# Patient Record
Sex: Male | Born: 1990 | Race: Black or African American | Hispanic: No
Health system: Southern US, Community
[De-identification: ages and names within clinical notes are randomized; demographics above are authoritative.]

---
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
## 2016-06-20 DIAGNOSIS — M62830 Muscle spasm of back: Secondary | ICD-10-CM | POA: Diagnosis not present

## 2016-06-20 DIAGNOSIS — M9903 Segmental and somatic dysfunction of lumbar region: Secondary | ICD-10-CM | POA: Diagnosis not present

## 2016-06-20 DIAGNOSIS — M9904 Segmental and somatic dysfunction of sacral region: Secondary | ICD-10-CM | POA: Diagnosis not present

## 2016-06-20 DIAGNOSIS — M545 Low back pain: Secondary | ICD-10-CM | POA: Diagnosis not present

## 2016-07-12 DIAGNOSIS — M545 Low back pain: Secondary | ICD-10-CM | POA: Diagnosis not present

## 2016-07-12 DIAGNOSIS — M62830 Muscle spasm of back: Secondary | ICD-10-CM | POA: Diagnosis not present

## 2016-07-12 DIAGNOSIS — M9903 Segmental and somatic dysfunction of lumbar region: Secondary | ICD-10-CM | POA: Diagnosis not present

## 2016-07-12 DIAGNOSIS — M9904 Segmental and somatic dysfunction of sacral region: Secondary | ICD-10-CM | POA: Diagnosis not present

## 2016-07-23 DIAGNOSIS — Z1389 Encounter for screening for other disorder: Secondary | ICD-10-CM | POA: Diagnosis not present

## 2016-07-23 DIAGNOSIS — Z0000 Encounter for general adult medical examination without abnormal findings: Secondary | ICD-10-CM | POA: Diagnosis not present

## 2016-07-23 DIAGNOSIS — Z23 Encounter for immunization: Secondary | ICD-10-CM | POA: Diagnosis not present

## 2016-07-23 DIAGNOSIS — E782 Mixed hyperlipidemia: Secondary | ICD-10-CM | POA: Diagnosis not present

## 2016-08-02 DIAGNOSIS — M545 Low back pain: Secondary | ICD-10-CM | POA: Diagnosis not present

## 2016-08-02 DIAGNOSIS — M9903 Segmental and somatic dysfunction of lumbar region: Secondary | ICD-10-CM | POA: Diagnosis not present

## 2016-08-02 DIAGNOSIS — M62830 Muscle spasm of back: Secondary | ICD-10-CM | POA: Diagnosis not present

## 2016-08-02 DIAGNOSIS — M9904 Segmental and somatic dysfunction of sacral region: Secondary | ICD-10-CM | POA: Diagnosis not present

## 2016-08-03 DIAGNOSIS — L219 Seborrheic dermatitis, unspecified: Secondary | ICD-10-CM | POA: Diagnosis not present

## 2016-08-03 DIAGNOSIS — L700 Acne vulgaris: Secondary | ICD-10-CM | POA: Diagnosis not present

## 2016-08-29 DIAGNOSIS — M9903 Segmental and somatic dysfunction of lumbar region: Secondary | ICD-10-CM | POA: Diagnosis not present

## 2016-08-29 DIAGNOSIS — M62830 Muscle spasm of back: Secondary | ICD-10-CM | POA: Diagnosis not present

## 2016-08-29 DIAGNOSIS — M9904 Segmental and somatic dysfunction of sacral region: Secondary | ICD-10-CM | POA: Diagnosis not present

## 2016-08-29 DIAGNOSIS — M545 Low back pain: Secondary | ICD-10-CM | POA: Diagnosis not present

## 2016-09-27 DIAGNOSIS — M9903 Segmental and somatic dysfunction of lumbar region: Secondary | ICD-10-CM | POA: Diagnosis not present

## 2016-09-27 DIAGNOSIS — M9904 Segmental and somatic dysfunction of sacral region: Secondary | ICD-10-CM | POA: Diagnosis not present

## 2016-09-27 DIAGNOSIS — M545 Low back pain: Secondary | ICD-10-CM | POA: Diagnosis not present

## 2016-09-27 DIAGNOSIS — M62830 Muscle spasm of back: Secondary | ICD-10-CM | POA: Diagnosis not present

## 2016-10-29 DIAGNOSIS — M62830 Muscle spasm of back: Secondary | ICD-10-CM | POA: Diagnosis not present

## 2016-10-29 DIAGNOSIS — M9903 Segmental and somatic dysfunction of lumbar region: Secondary | ICD-10-CM | POA: Diagnosis not present

## 2016-10-29 DIAGNOSIS — M9904 Segmental and somatic dysfunction of sacral region: Secondary | ICD-10-CM | POA: Diagnosis not present

## 2016-10-29 DIAGNOSIS — M545 Low back pain: Secondary | ICD-10-CM | POA: Diagnosis not present

## 2016-11-06 DIAGNOSIS — L700 Acne vulgaris: Secondary | ICD-10-CM | POA: Diagnosis not present

## 2016-11-12 DIAGNOSIS — M9904 Segmental and somatic dysfunction of sacral region: Secondary | ICD-10-CM | POA: Diagnosis not present

## 2016-11-12 DIAGNOSIS — M62830 Muscle spasm of back: Secondary | ICD-10-CM | POA: Diagnosis not present

## 2016-11-12 DIAGNOSIS — M545 Low back pain: Secondary | ICD-10-CM | POA: Diagnosis not present

## 2016-11-12 DIAGNOSIS — M9903 Segmental and somatic dysfunction of lumbar region: Secondary | ICD-10-CM | POA: Diagnosis not present

## 2016-11-27 DIAGNOSIS — M545 Low back pain: Secondary | ICD-10-CM | POA: Diagnosis not present

## 2016-11-27 DIAGNOSIS — M62830 Muscle spasm of back: Secondary | ICD-10-CM | POA: Diagnosis not present

## 2016-11-27 DIAGNOSIS — M9903 Segmental and somatic dysfunction of lumbar region: Secondary | ICD-10-CM | POA: Diagnosis not present

## 2016-11-27 DIAGNOSIS — M9904 Segmental and somatic dysfunction of sacral region: Secondary | ICD-10-CM | POA: Diagnosis not present

## 2016-12-18 DIAGNOSIS — M9904 Segmental and somatic dysfunction of sacral region: Secondary | ICD-10-CM | POA: Diagnosis not present

## 2016-12-18 DIAGNOSIS — M545 Low back pain: Secondary | ICD-10-CM | POA: Diagnosis not present

## 2016-12-18 DIAGNOSIS — M9903 Segmental and somatic dysfunction of lumbar region: Secondary | ICD-10-CM | POA: Diagnosis not present

## 2016-12-18 DIAGNOSIS — M62830 Muscle spasm of back: Secondary | ICD-10-CM | POA: Diagnosis not present

## 2017-01-01 DIAGNOSIS — M9904 Segmental and somatic dysfunction of sacral region: Secondary | ICD-10-CM | POA: Diagnosis not present

## 2017-01-01 DIAGNOSIS — M62830 Muscle spasm of back: Secondary | ICD-10-CM | POA: Diagnosis not present

## 2017-01-01 DIAGNOSIS — M9903 Segmental and somatic dysfunction of lumbar region: Secondary | ICD-10-CM | POA: Diagnosis not present

## 2017-01-01 DIAGNOSIS — M545 Low back pain: Secondary | ICD-10-CM | POA: Diagnosis not present

## 2017-01-23 DIAGNOSIS — M9903 Segmental and somatic dysfunction of lumbar region: Secondary | ICD-10-CM | POA: Diagnosis not present

## 2017-01-23 DIAGNOSIS — M9904 Segmental and somatic dysfunction of sacral region: Secondary | ICD-10-CM | POA: Diagnosis not present

## 2017-01-23 DIAGNOSIS — M62830 Muscle spasm of back: Secondary | ICD-10-CM | POA: Diagnosis not present

## 2017-01-23 DIAGNOSIS — M545 Low back pain: Secondary | ICD-10-CM | POA: Diagnosis not present

## 2017-01-29 DIAGNOSIS — B351 Tinea unguium: Secondary | ICD-10-CM | POA: Diagnosis not present

## 2017-01-29 DIAGNOSIS — R0681 Apnea, not elsewhere classified: Secondary | ICD-10-CM | POA: Diagnosis not present

## 2017-01-29 DIAGNOSIS — R0683 Snoring: Secondary | ICD-10-CM | POA: Diagnosis not present

## 2017-01-29 DIAGNOSIS — R5383 Other fatigue: Secondary | ICD-10-CM | POA: Diagnosis not present

## 2017-02-08 DIAGNOSIS — L700 Acne vulgaris: Secondary | ICD-10-CM | POA: Diagnosis not present

## 2017-02-25 DIAGNOSIS — M9903 Segmental and somatic dysfunction of lumbar region: Secondary | ICD-10-CM | POA: Diagnosis not present

## 2017-02-25 DIAGNOSIS — M62830 Muscle spasm of back: Secondary | ICD-10-CM | POA: Diagnosis not present

## 2017-02-25 DIAGNOSIS — M9904 Segmental and somatic dysfunction of sacral region: Secondary | ICD-10-CM | POA: Diagnosis not present

## 2017-02-25 DIAGNOSIS — M545 Low back pain: Secondary | ICD-10-CM | POA: Diagnosis not present

## 2017-03-13 DIAGNOSIS — R197 Diarrhea, unspecified: Secondary | ICD-10-CM | POA: Diagnosis not present

## 2017-03-29 DIAGNOSIS — R197 Diarrhea, unspecified: Secondary | ICD-10-CM | POA: Diagnosis not present

## 2017-03-29 DIAGNOSIS — K529 Noninfective gastroenteritis and colitis, unspecified: Secondary | ICD-10-CM | POA: Diagnosis not present

## 2017-06-05 DIAGNOSIS — L700 Acne vulgaris: Secondary | ICD-10-CM | POA: Diagnosis not present

## 2017-06-11 DIAGNOSIS — M9904 Segmental and somatic dysfunction of sacral region: Secondary | ICD-10-CM | POA: Diagnosis not present

## 2017-06-11 DIAGNOSIS — M62830 Muscle spasm of back: Secondary | ICD-10-CM | POA: Diagnosis not present

## 2017-06-11 DIAGNOSIS — M545 Low back pain: Secondary | ICD-10-CM | POA: Diagnosis not present

## 2017-06-11 DIAGNOSIS — M9903 Segmental and somatic dysfunction of lumbar region: Secondary | ICD-10-CM | POA: Diagnosis not present

## 2017-07-23 DIAGNOSIS — Z1389 Encounter for screening for other disorder: Secondary | ICD-10-CM | POA: Diagnosis not present

## 2017-07-23 DIAGNOSIS — Z0000 Encounter for general adult medical examination without abnormal findings: Secondary | ICD-10-CM | POA: Diagnosis not present

## 2017-07-23 DIAGNOSIS — Z23 Encounter for immunization: Secondary | ICD-10-CM | POA: Diagnosis not present

## 2017-08-01 DIAGNOSIS — M9903 Segmental and somatic dysfunction of lumbar region: Secondary | ICD-10-CM | POA: Diagnosis not present

## 2017-08-01 DIAGNOSIS — M545 Low back pain: Secondary | ICD-10-CM | POA: Diagnosis not present

## 2017-08-01 DIAGNOSIS — M62830 Muscle spasm of back: Secondary | ICD-10-CM | POA: Diagnosis not present

## 2017-08-01 DIAGNOSIS — M9904 Segmental and somatic dysfunction of sacral region: Secondary | ICD-10-CM | POA: Diagnosis not present

## 2017-08-03 DIAGNOSIS — J029 Acute pharyngitis, unspecified: Secondary | ICD-10-CM | POA: Diagnosis not present

## 2017-08-03 DIAGNOSIS — J019 Acute sinusitis, unspecified: Secondary | ICD-10-CM | POA: Diagnosis not present

## 2017-08-07 DIAGNOSIS — H33302 Unspecified retinal break, left eye: Secondary | ICD-10-CM | POA: Diagnosis not present

## 2017-08-07 DIAGNOSIS — H5213 Myopia, bilateral: Secondary | ICD-10-CM | POA: Diagnosis not present

## 2017-08-08 DIAGNOSIS — M545 Low back pain: Secondary | ICD-10-CM | POA: Diagnosis not present

## 2017-08-08 DIAGNOSIS — M9903 Segmental and somatic dysfunction of lumbar region: Secondary | ICD-10-CM | POA: Diagnosis not present

## 2017-08-08 DIAGNOSIS — M9904 Segmental and somatic dysfunction of sacral region: Secondary | ICD-10-CM | POA: Diagnosis not present

## 2017-08-08 DIAGNOSIS — Z0000 Encounter for general adult medical examination without abnormal findings: Secondary | ICD-10-CM | POA: Diagnosis not present

## 2017-08-08 DIAGNOSIS — M62830 Muscle spasm of back: Secondary | ICD-10-CM | POA: Diagnosis not present

## 2017-08-09 DIAGNOSIS — B029 Zoster without complications: Secondary | ICD-10-CM | POA: Diagnosis not present

## 2017-08-09 DIAGNOSIS — J069 Acute upper respiratory infection, unspecified: Secondary | ICD-10-CM | POA: Diagnosis not present

## 2017-08-29 DIAGNOSIS — M9904 Segmental and somatic dysfunction of sacral region: Secondary | ICD-10-CM | POA: Diagnosis not present

## 2017-08-29 DIAGNOSIS — M62830 Muscle spasm of back: Secondary | ICD-10-CM | POA: Diagnosis not present

## 2017-08-29 DIAGNOSIS — M9903 Segmental and somatic dysfunction of lumbar region: Secondary | ICD-10-CM | POA: Diagnosis not present

## 2017-08-29 DIAGNOSIS — M545 Low back pain: Secondary | ICD-10-CM | POA: Diagnosis not present

## 2017-09-10 DIAGNOSIS — G4733 Obstructive sleep apnea (adult) (pediatric): Secondary | ICD-10-CM | POA: Diagnosis not present

## 2018-02-26 DIAGNOSIS — M62830 Muscle spasm of back: Secondary | ICD-10-CM | POA: Diagnosis not present

## 2018-02-26 DIAGNOSIS — M545 Low back pain: Secondary | ICD-10-CM | POA: Diagnosis not present

## 2018-02-26 DIAGNOSIS — M9904 Segmental and somatic dysfunction of sacral region: Secondary | ICD-10-CM | POA: Diagnosis not present

## 2018-02-26 DIAGNOSIS — M9903 Segmental and somatic dysfunction of lumbar region: Secondary | ICD-10-CM | POA: Diagnosis not present

## 2018-07-29 DIAGNOSIS — Z23 Encounter for immunization: Secondary | ICD-10-CM | POA: Diagnosis not present

## 2018-07-29 DIAGNOSIS — Z1389 Encounter for screening for other disorder: Secondary | ICD-10-CM | POA: Diagnosis not present

## 2018-07-29 DIAGNOSIS — Z0000 Encounter for general adult medical examination without abnormal findings: Secondary | ICD-10-CM | POA: Diagnosis not present

## 2018-07-29 DIAGNOSIS — E782 Mixed hyperlipidemia: Secondary | ICD-10-CM | POA: Diagnosis not present

## 2018-09-30 DIAGNOSIS — H35412 Lattice degeneration of retina, left eye: Secondary | ICD-10-CM | POA: Diagnosis not present

## 2018-09-30 DIAGNOSIS — H33302 Unspecified retinal break, left eye: Secondary | ICD-10-CM | POA: Diagnosis not present

## 2018-09-30 DIAGNOSIS — H5213 Myopia, bilateral: Secondary | ICD-10-CM | POA: Diagnosis not present

## 2018-10-06 DIAGNOSIS — M9904 Segmental and somatic dysfunction of sacral region: Secondary | ICD-10-CM | POA: Diagnosis not present

## 2018-10-06 DIAGNOSIS — M545 Low back pain: Secondary | ICD-10-CM | POA: Diagnosis not present

## 2018-10-06 DIAGNOSIS — M9903 Segmental and somatic dysfunction of lumbar region: Secondary | ICD-10-CM | POA: Diagnosis not present

## 2018-10-06 DIAGNOSIS — M62830 Muscle spasm of back: Secondary | ICD-10-CM | POA: Diagnosis not present

## 2018-10-13 DIAGNOSIS — M62830 Muscle spasm of back: Secondary | ICD-10-CM | POA: Diagnosis not present

## 2018-10-13 DIAGNOSIS — M9904 Segmental and somatic dysfunction of sacral region: Secondary | ICD-10-CM | POA: Diagnosis not present

## 2018-10-13 DIAGNOSIS — M545 Low back pain: Secondary | ICD-10-CM | POA: Diagnosis not present

## 2018-10-13 DIAGNOSIS — M9903 Segmental and somatic dysfunction of lumbar region: Secondary | ICD-10-CM | POA: Diagnosis not present

## 2018-10-27 DIAGNOSIS — M9904 Segmental and somatic dysfunction of sacral region: Secondary | ICD-10-CM | POA: Diagnosis not present

## 2018-10-27 DIAGNOSIS — M62830 Muscle spasm of back: Secondary | ICD-10-CM | POA: Diagnosis not present

## 2018-10-27 DIAGNOSIS — M9903 Segmental and somatic dysfunction of lumbar region: Secondary | ICD-10-CM | POA: Diagnosis not present

## 2018-10-27 DIAGNOSIS — M545 Low back pain: Secondary | ICD-10-CM | POA: Diagnosis not present

## 2018-11-05 DIAGNOSIS — M545 Low back pain: Secondary | ICD-10-CM | POA: Diagnosis not present

## 2018-11-05 DIAGNOSIS — M9904 Segmental and somatic dysfunction of sacral region: Secondary | ICD-10-CM | POA: Diagnosis not present

## 2018-11-05 DIAGNOSIS — M62830 Muscle spasm of back: Secondary | ICD-10-CM | POA: Diagnosis not present

## 2018-11-05 DIAGNOSIS — M9903 Segmental and somatic dysfunction of lumbar region: Secondary | ICD-10-CM | POA: Diagnosis not present

## 2018-12-31 DIAGNOSIS — M62830 Muscle spasm of back: Secondary | ICD-10-CM | POA: Diagnosis not present

## 2018-12-31 DIAGNOSIS — M545 Low back pain: Secondary | ICD-10-CM | POA: Diagnosis not present

## 2018-12-31 DIAGNOSIS — M9904 Segmental and somatic dysfunction of sacral region: Secondary | ICD-10-CM | POA: Diagnosis not present

## 2018-12-31 DIAGNOSIS — M9903 Segmental and somatic dysfunction of lumbar region: Secondary | ICD-10-CM | POA: Diagnosis not present

## 2019-01-14 DIAGNOSIS — M62830 Muscle spasm of back: Secondary | ICD-10-CM | POA: Diagnosis not present

## 2019-01-14 DIAGNOSIS — M545 Low back pain: Secondary | ICD-10-CM | POA: Diagnosis not present

## 2019-01-14 DIAGNOSIS — M9904 Segmental and somatic dysfunction of sacral region: Secondary | ICD-10-CM | POA: Diagnosis not present

## 2019-01-14 DIAGNOSIS — M9903 Segmental and somatic dysfunction of lumbar region: Secondary | ICD-10-CM | POA: Diagnosis not present

## 2019-01-28 DIAGNOSIS — M9904 Segmental and somatic dysfunction of sacral region: Secondary | ICD-10-CM | POA: Diagnosis not present

## 2019-01-28 DIAGNOSIS — M9903 Segmental and somatic dysfunction of lumbar region: Secondary | ICD-10-CM | POA: Diagnosis not present

## 2019-01-28 DIAGNOSIS — M62830 Muscle spasm of back: Secondary | ICD-10-CM | POA: Diagnosis not present

## 2019-01-28 DIAGNOSIS — M545 Low back pain: Secondary | ICD-10-CM | POA: Diagnosis not present

## 2019-02-12 DIAGNOSIS — M62830 Muscle spasm of back: Secondary | ICD-10-CM | POA: Diagnosis not present

## 2019-02-12 DIAGNOSIS — M545 Low back pain: Secondary | ICD-10-CM | POA: Diagnosis not present

## 2019-02-12 DIAGNOSIS — M9904 Segmental and somatic dysfunction of sacral region: Secondary | ICD-10-CM | POA: Diagnosis not present

## 2019-02-12 DIAGNOSIS — M9903 Segmental and somatic dysfunction of lumbar region: Secondary | ICD-10-CM | POA: Diagnosis not present

## 2019-02-26 DIAGNOSIS — M545 Low back pain: Secondary | ICD-10-CM | POA: Diagnosis not present

## 2019-02-26 DIAGNOSIS — M62830 Muscle spasm of back: Secondary | ICD-10-CM | POA: Diagnosis not present

## 2019-02-26 DIAGNOSIS — M9903 Segmental and somatic dysfunction of lumbar region: Secondary | ICD-10-CM | POA: Diagnosis not present

## 2019-02-26 DIAGNOSIS — M9904 Segmental and somatic dysfunction of sacral region: Secondary | ICD-10-CM | POA: Diagnosis not present

## 2019-03-11 DIAGNOSIS — M542 Cervicalgia: Secondary | ICD-10-CM | POA: Diagnosis not present

## 2019-03-11 DIAGNOSIS — M9902 Segmental and somatic dysfunction of thoracic region: Secondary | ICD-10-CM | POA: Diagnosis not present

## 2019-03-11 DIAGNOSIS — M9901 Segmental and somatic dysfunction of cervical region: Secondary | ICD-10-CM | POA: Diagnosis not present

## 2019-03-11 DIAGNOSIS — M9907 Segmental and somatic dysfunction of upper extremity: Secondary | ICD-10-CM | POA: Diagnosis not present

## 2019-03-25 DIAGNOSIS — M542 Cervicalgia: Secondary | ICD-10-CM | POA: Diagnosis not present

## 2019-03-25 DIAGNOSIS — M9901 Segmental and somatic dysfunction of cervical region: Secondary | ICD-10-CM | POA: Diagnosis not present

## 2019-03-25 DIAGNOSIS — M9902 Segmental and somatic dysfunction of thoracic region: Secondary | ICD-10-CM | POA: Diagnosis not present

## 2019-03-25 DIAGNOSIS — M9907 Segmental and somatic dysfunction of upper extremity: Secondary | ICD-10-CM | POA: Diagnosis not present

## 2019-04-15 DIAGNOSIS — M9907 Segmental and somatic dysfunction of upper extremity: Secondary | ICD-10-CM | POA: Diagnosis not present

## 2019-04-15 DIAGNOSIS — M9901 Segmental and somatic dysfunction of cervical region: Secondary | ICD-10-CM | POA: Diagnosis not present

## 2019-04-15 DIAGNOSIS — M9902 Segmental and somatic dysfunction of thoracic region: Secondary | ICD-10-CM | POA: Diagnosis not present

## 2019-04-15 DIAGNOSIS — M542 Cervicalgia: Secondary | ICD-10-CM | POA: Diagnosis not present

## 2019-05-13 DIAGNOSIS — M9901 Segmental and somatic dysfunction of cervical region: Secondary | ICD-10-CM | POA: Diagnosis not present

## 2019-05-13 DIAGNOSIS — M9902 Segmental and somatic dysfunction of thoracic region: Secondary | ICD-10-CM | POA: Diagnosis not present

## 2019-05-13 DIAGNOSIS — M9907 Segmental and somatic dysfunction of upper extremity: Secondary | ICD-10-CM | POA: Diagnosis not present

## 2019-05-13 DIAGNOSIS — M542 Cervicalgia: Secondary | ICD-10-CM | POA: Diagnosis not present

## 2019-05-27 DIAGNOSIS — M542 Cervicalgia: Secondary | ICD-10-CM | POA: Diagnosis not present

## 2019-05-27 DIAGNOSIS — M9907 Segmental and somatic dysfunction of upper extremity: Secondary | ICD-10-CM | POA: Diagnosis not present

## 2019-05-27 DIAGNOSIS — M9902 Segmental and somatic dysfunction of thoracic region: Secondary | ICD-10-CM | POA: Diagnosis not present

## 2019-05-27 DIAGNOSIS — M9901 Segmental and somatic dysfunction of cervical region: Secondary | ICD-10-CM | POA: Diagnosis not present

## 2019-06-04 DIAGNOSIS — U071 COVID-19: Secondary | ICD-10-CM | POA: Diagnosis not present

## 2019-06-04 DIAGNOSIS — Z1159 Encounter for screening for other viral diseases: Secondary | ICD-10-CM | POA: Diagnosis not present

## 2019-06-10 DIAGNOSIS — M9902 Segmental and somatic dysfunction of thoracic region: Secondary | ICD-10-CM | POA: Diagnosis not present

## 2019-06-10 DIAGNOSIS — M542 Cervicalgia: Secondary | ICD-10-CM | POA: Diagnosis not present

## 2019-06-10 DIAGNOSIS — M9901 Segmental and somatic dysfunction of cervical region: Secondary | ICD-10-CM | POA: Diagnosis not present

## 2019-06-10 DIAGNOSIS — M9907 Segmental and somatic dysfunction of upper extremity: Secondary | ICD-10-CM | POA: Diagnosis not present

## 2019-06-12 DIAGNOSIS — Z20828 Contact with and (suspected) exposure to other viral communicable diseases: Secondary | ICD-10-CM | POA: Diagnosis not present

## 2019-06-12 DIAGNOSIS — Z1159 Encounter for screening for other viral diseases: Secondary | ICD-10-CM | POA: Diagnosis not present

## 2019-06-12 DIAGNOSIS — U071 COVID-19: Secondary | ICD-10-CM | POA: Diagnosis not present

## 2019-07-01 DIAGNOSIS — M9901 Segmental and somatic dysfunction of cervical region: Secondary | ICD-10-CM | POA: Diagnosis not present

## 2019-07-01 DIAGNOSIS — M9902 Segmental and somatic dysfunction of thoracic region: Secondary | ICD-10-CM | POA: Diagnosis not present

## 2019-07-01 DIAGNOSIS — M9907 Segmental and somatic dysfunction of upper extremity: Secondary | ICD-10-CM | POA: Diagnosis not present

## 2019-07-01 DIAGNOSIS — M542 Cervicalgia: Secondary | ICD-10-CM | POA: Diagnosis not present

## 2019-07-29 DIAGNOSIS — M9907 Segmental and somatic dysfunction of upper extremity: Secondary | ICD-10-CM | POA: Diagnosis not present

## 2019-07-29 DIAGNOSIS — M9901 Segmental and somatic dysfunction of cervical region: Secondary | ICD-10-CM | POA: Diagnosis not present

## 2019-07-29 DIAGNOSIS — M542 Cervicalgia: Secondary | ICD-10-CM | POA: Diagnosis not present

## 2019-07-29 DIAGNOSIS — M9902 Segmental and somatic dysfunction of thoracic region: Secondary | ICD-10-CM | POA: Diagnosis not present

## 2019-07-31 DIAGNOSIS — E782 Mixed hyperlipidemia: Secondary | ICD-10-CM | POA: Diagnosis not present

## 2019-07-31 DIAGNOSIS — Z0000 Encounter for general adult medical examination without abnormal findings: Secondary | ICD-10-CM | POA: Diagnosis not present

## 2019-07-31 DIAGNOSIS — Z1389 Encounter for screening for other disorder: Secondary | ICD-10-CM | POA: Diagnosis not present

## 2019-08-18 DIAGNOSIS — M542 Cervicalgia: Secondary | ICD-10-CM | POA: Diagnosis not present

## 2019-08-18 DIAGNOSIS — M9902 Segmental and somatic dysfunction of thoracic region: Secondary | ICD-10-CM | POA: Diagnosis not present

## 2019-08-18 DIAGNOSIS — M9907 Segmental and somatic dysfunction of upper extremity: Secondary | ICD-10-CM | POA: Diagnosis not present

## 2019-08-18 DIAGNOSIS — M9901 Segmental and somatic dysfunction of cervical region: Secondary | ICD-10-CM | POA: Diagnosis not present

## 2019-08-31 DIAGNOSIS — U071 COVID-19: Secondary | ICD-10-CM | POA: Diagnosis not present

## 2019-08-31 DIAGNOSIS — Z1159 Encounter for screening for other viral diseases: Secondary | ICD-10-CM | POA: Diagnosis not present

## 2019-09-07 DIAGNOSIS — E291 Testicular hypofunction: Secondary | ICD-10-CM | POA: Diagnosis not present

## 2019-09-08 DIAGNOSIS — E291 Testicular hypofunction: Secondary | ICD-10-CM | POA: Diagnosis not present

## 2019-09-09 DIAGNOSIS — M542 Cervicalgia: Secondary | ICD-10-CM | POA: Diagnosis not present

## 2019-09-09 DIAGNOSIS — M9907 Segmental and somatic dysfunction of upper extremity: Secondary | ICD-10-CM | POA: Diagnosis not present

## 2019-09-09 DIAGNOSIS — M9901 Segmental and somatic dysfunction of cervical region: Secondary | ICD-10-CM | POA: Diagnosis not present

## 2019-09-09 DIAGNOSIS — M9902 Segmental and somatic dysfunction of thoracic region: Secondary | ICD-10-CM | POA: Diagnosis not present

## 2019-09-30 DIAGNOSIS — M9902 Segmental and somatic dysfunction of thoracic region: Secondary | ICD-10-CM | POA: Diagnosis not present

## 2019-09-30 DIAGNOSIS — M9901 Segmental and somatic dysfunction of cervical region: Secondary | ICD-10-CM | POA: Diagnosis not present

## 2019-09-30 DIAGNOSIS — M542 Cervicalgia: Secondary | ICD-10-CM | POA: Diagnosis not present

## 2019-09-30 DIAGNOSIS — M9907 Segmental and somatic dysfunction of upper extremity: Secondary | ICD-10-CM | POA: Diagnosis not present

## 2019-10-12 DIAGNOSIS — F99 Mental disorder, not otherwise specified: Secondary | ICD-10-CM | POA: Diagnosis not present

## 2019-10-19 DIAGNOSIS — M542 Cervicalgia: Secondary | ICD-10-CM | POA: Diagnosis not present

## 2019-10-19 DIAGNOSIS — M9907 Segmental and somatic dysfunction of upper extremity: Secondary | ICD-10-CM | POA: Diagnosis not present

## 2019-10-19 DIAGNOSIS — F99 Mental disorder, not otherwise specified: Secondary | ICD-10-CM | POA: Diagnosis not present

## 2019-10-19 DIAGNOSIS — M9902 Segmental and somatic dysfunction of thoracic region: Secondary | ICD-10-CM | POA: Diagnosis not present

## 2019-10-19 DIAGNOSIS — M9901 Segmental and somatic dysfunction of cervical region: Secondary | ICD-10-CM | POA: Diagnosis not present

## 2019-11-11 DIAGNOSIS — F99 Mental disorder, not otherwise specified: Secondary | ICD-10-CM | POA: Diagnosis not present

## 2019-11-18 DIAGNOSIS — M9901 Segmental and somatic dysfunction of cervical region: Secondary | ICD-10-CM | POA: Diagnosis not present

## 2019-11-18 DIAGNOSIS — M9902 Segmental and somatic dysfunction of thoracic region: Secondary | ICD-10-CM | POA: Diagnosis not present

## 2019-11-18 DIAGNOSIS — M9907 Segmental and somatic dysfunction of upper extremity: Secondary | ICD-10-CM | POA: Diagnosis not present

## 2019-11-18 DIAGNOSIS — M542 Cervicalgia: Secondary | ICD-10-CM | POA: Diagnosis not present

## 2019-11-20 DIAGNOSIS — F99 Mental disorder, not otherwise specified: Secondary | ICD-10-CM | POA: Diagnosis not present

## 2019-11-25 DIAGNOSIS — M9901 Segmental and somatic dysfunction of cervical region: Secondary | ICD-10-CM | POA: Diagnosis not present

## 2019-11-25 DIAGNOSIS — M9902 Segmental and somatic dysfunction of thoracic region: Secondary | ICD-10-CM | POA: Diagnosis not present

## 2019-11-25 DIAGNOSIS — M9907 Segmental and somatic dysfunction of upper extremity: Secondary | ICD-10-CM | POA: Diagnosis not present

## 2019-11-25 DIAGNOSIS — M542 Cervicalgia: Secondary | ICD-10-CM | POA: Diagnosis not present

## 2019-12-02 DIAGNOSIS — M542 Cervicalgia: Secondary | ICD-10-CM | POA: Diagnosis not present

## 2019-12-02 DIAGNOSIS — M9901 Segmental and somatic dysfunction of cervical region: Secondary | ICD-10-CM | POA: Diagnosis not present

## 2019-12-02 DIAGNOSIS — M9902 Segmental and somatic dysfunction of thoracic region: Secondary | ICD-10-CM | POA: Diagnosis not present

## 2019-12-02 DIAGNOSIS — M9907 Segmental and somatic dysfunction of upper extremity: Secondary | ICD-10-CM | POA: Diagnosis not present

## 2019-12-24 DIAGNOSIS — M9902 Segmental and somatic dysfunction of thoracic region: Secondary | ICD-10-CM | POA: Diagnosis not present

## 2019-12-24 DIAGNOSIS — M9901 Segmental and somatic dysfunction of cervical region: Secondary | ICD-10-CM | POA: Diagnosis not present

## 2019-12-24 DIAGNOSIS — M9907 Segmental and somatic dysfunction of upper extremity: Secondary | ICD-10-CM | POA: Diagnosis not present

## 2019-12-24 DIAGNOSIS — M542 Cervicalgia: Secondary | ICD-10-CM | POA: Diagnosis not present

## 2019-12-31 DIAGNOSIS — F99 Mental disorder, not otherwise specified: Secondary | ICD-10-CM | POA: Diagnosis not present

## 2020-01-13 DIAGNOSIS — M9907 Segmental and somatic dysfunction of upper extremity: Secondary | ICD-10-CM | POA: Diagnosis not present

## 2020-01-13 DIAGNOSIS — M9901 Segmental and somatic dysfunction of cervical region: Secondary | ICD-10-CM | POA: Diagnosis not present

## 2020-01-13 DIAGNOSIS — M542 Cervicalgia: Secondary | ICD-10-CM | POA: Diagnosis not present

## 2020-01-13 DIAGNOSIS — M9902 Segmental and somatic dysfunction of thoracic region: Secondary | ICD-10-CM | POA: Diagnosis not present

## 2020-01-14 DIAGNOSIS — F99 Mental disorder, not otherwise specified: Secondary | ICD-10-CM | POA: Diagnosis not present

## 2020-02-01 DIAGNOSIS — F99 Mental disorder, not otherwise specified: Secondary | ICD-10-CM | POA: Diagnosis not present

## 2020-02-02 DIAGNOSIS — F99 Mental disorder, not otherwise specified: Secondary | ICD-10-CM | POA: Diagnosis not present

## 2020-02-03 DIAGNOSIS — M9902 Segmental and somatic dysfunction of thoracic region: Secondary | ICD-10-CM | POA: Diagnosis not present

## 2020-02-03 DIAGNOSIS — M9907 Segmental and somatic dysfunction of upper extremity: Secondary | ICD-10-CM | POA: Diagnosis not present

## 2020-02-03 DIAGNOSIS — M9901 Segmental and somatic dysfunction of cervical region: Secondary | ICD-10-CM | POA: Diagnosis not present

## 2020-02-03 DIAGNOSIS — M542 Cervicalgia: Secondary | ICD-10-CM | POA: Diagnosis not present

## 2020-02-10 DIAGNOSIS — R599 Enlarged lymph nodes, unspecified: Secondary | ICD-10-CM | POA: Diagnosis not present

## 2020-02-17 DIAGNOSIS — E291 Testicular hypofunction: Secondary | ICD-10-CM | POA: Diagnosis not present

## 2020-02-17 DIAGNOSIS — Z125 Encounter for screening for malignant neoplasm of prostate: Secondary | ICD-10-CM | POA: Diagnosis not present

## 2020-02-24 DIAGNOSIS — M9907 Segmental and somatic dysfunction of upper extremity: Secondary | ICD-10-CM | POA: Diagnosis not present

## 2020-02-24 DIAGNOSIS — M9901 Segmental and somatic dysfunction of cervical region: Secondary | ICD-10-CM | POA: Diagnosis not present

## 2020-02-24 DIAGNOSIS — M9902 Segmental and somatic dysfunction of thoracic region: Secondary | ICD-10-CM | POA: Diagnosis not present

## 2020-02-24 DIAGNOSIS — E291 Testicular hypofunction: Secondary | ICD-10-CM | POA: Diagnosis not present

## 2020-02-24 DIAGNOSIS — M542 Cervicalgia: Secondary | ICD-10-CM | POA: Diagnosis not present

## 2020-03-02 DIAGNOSIS — F99 Mental disorder, not otherwise specified: Secondary | ICD-10-CM | POA: Diagnosis not present

## 2020-03-07 DIAGNOSIS — F99 Mental disorder, not otherwise specified: Secondary | ICD-10-CM | POA: Diagnosis not present

## 2020-03-14 DIAGNOSIS — Z03818 Encounter for observation for suspected exposure to other biological agents ruled out: Secondary | ICD-10-CM | POA: Diagnosis not present

## 2020-03-14 DIAGNOSIS — Z20822 Contact with and (suspected) exposure to covid-19: Secondary | ICD-10-CM | POA: Diagnosis not present

## 2020-03-15 DIAGNOSIS — M9902 Segmental and somatic dysfunction of thoracic region: Secondary | ICD-10-CM | POA: Diagnosis not present

## 2020-03-15 DIAGNOSIS — M9901 Segmental and somatic dysfunction of cervical region: Secondary | ICD-10-CM | POA: Diagnosis not present

## 2020-03-15 DIAGNOSIS — M542 Cervicalgia: Secondary | ICD-10-CM | POA: Diagnosis not present

## 2020-03-15 DIAGNOSIS — M9907 Segmental and somatic dysfunction of upper extremity: Secondary | ICD-10-CM | POA: Diagnosis not present

## 2020-03-28 DIAGNOSIS — M542 Cervicalgia: Secondary | ICD-10-CM | POA: Diagnosis not present

## 2020-03-28 DIAGNOSIS — M9907 Segmental and somatic dysfunction of upper extremity: Secondary | ICD-10-CM | POA: Diagnosis not present

## 2020-03-28 DIAGNOSIS — M9901 Segmental and somatic dysfunction of cervical region: Secondary | ICD-10-CM | POA: Diagnosis not present

## 2020-03-28 DIAGNOSIS — M9902 Segmental and somatic dysfunction of thoracic region: Secondary | ICD-10-CM | POA: Diagnosis not present

## 2020-04-08 DIAGNOSIS — Z20822 Contact with and (suspected) exposure to covid-19: Secondary | ICD-10-CM | POA: Diagnosis not present

## 2020-04-19 DIAGNOSIS — M9907 Segmental and somatic dysfunction of upper extremity: Secondary | ICD-10-CM | POA: Diagnosis not present

## 2020-04-19 DIAGNOSIS — M9901 Segmental and somatic dysfunction of cervical region: Secondary | ICD-10-CM | POA: Diagnosis not present

## 2020-04-19 DIAGNOSIS — M542 Cervicalgia: Secondary | ICD-10-CM | POA: Diagnosis not present

## 2020-04-19 DIAGNOSIS — M9902 Segmental and somatic dysfunction of thoracic region: Secondary | ICD-10-CM | POA: Diagnosis not present

## 2020-04-20 DIAGNOSIS — F99 Mental disorder, not otherwise specified: Secondary | ICD-10-CM | POA: Diagnosis not present

## 2020-05-04 DIAGNOSIS — F99 Mental disorder, not otherwise specified: Secondary | ICD-10-CM | POA: Diagnosis not present

## 2020-05-11 DIAGNOSIS — F99 Mental disorder, not otherwise specified: Secondary | ICD-10-CM | POA: Diagnosis not present

## 2020-05-17 DIAGNOSIS — M542 Cervicalgia: Secondary | ICD-10-CM | POA: Diagnosis not present

## 2020-05-17 DIAGNOSIS — M9902 Segmental and somatic dysfunction of thoracic region: Secondary | ICD-10-CM | POA: Diagnosis not present

## 2020-05-17 DIAGNOSIS — M9907 Segmental and somatic dysfunction of upper extremity: Secondary | ICD-10-CM | POA: Diagnosis not present

## 2020-05-17 DIAGNOSIS — M9901 Segmental and somatic dysfunction of cervical region: Secondary | ICD-10-CM | POA: Diagnosis not present

## 2020-05-18 DIAGNOSIS — F99 Mental disorder, not otherwise specified: Secondary | ICD-10-CM | POA: Diagnosis not present

## 2020-05-31 DIAGNOSIS — Z20822 Contact with and (suspected) exposure to covid-19: Secondary | ICD-10-CM | POA: Diagnosis not present

## 2020-05-31 DIAGNOSIS — Z03818 Encounter for observation for suspected exposure to other biological agents ruled out: Secondary | ICD-10-CM | POA: Diagnosis not present

## 2020-06-08 DIAGNOSIS — F99 Mental disorder, not otherwise specified: Secondary | ICD-10-CM | POA: Diagnosis not present

## 2020-06-09 DIAGNOSIS — F99 Mental disorder, not otherwise specified: Secondary | ICD-10-CM | POA: Diagnosis not present

## 2020-06-16 DIAGNOSIS — M9901 Segmental and somatic dysfunction of cervical region: Secondary | ICD-10-CM | POA: Diagnosis not present

## 2020-06-16 DIAGNOSIS — M9902 Segmental and somatic dysfunction of thoracic region: Secondary | ICD-10-CM | POA: Diagnosis not present

## 2020-06-16 DIAGNOSIS — M9907 Segmental and somatic dysfunction of upper extremity: Secondary | ICD-10-CM | POA: Diagnosis not present

## 2020-06-16 DIAGNOSIS — M542 Cervicalgia: Secondary | ICD-10-CM | POA: Diagnosis not present

## 2020-06-28 DIAGNOSIS — M9901 Segmental and somatic dysfunction of cervical region: Secondary | ICD-10-CM | POA: Diagnosis not present

## 2020-06-28 DIAGNOSIS — M9907 Segmental and somatic dysfunction of upper extremity: Secondary | ICD-10-CM | POA: Diagnosis not present

## 2020-06-28 DIAGNOSIS — M542 Cervicalgia: Secondary | ICD-10-CM | POA: Diagnosis not present

## 2020-06-28 DIAGNOSIS — M9902 Segmental and somatic dysfunction of thoracic region: Secondary | ICD-10-CM | POA: Diagnosis not present

## 2020-07-11 DIAGNOSIS — F99 Mental disorder, not otherwise specified: Secondary | ICD-10-CM | POA: Diagnosis not present

## 2020-07-13 DIAGNOSIS — F99 Mental disorder, not otherwise specified: Secondary | ICD-10-CM | POA: Diagnosis not present

## 2020-07-14 DIAGNOSIS — M542 Cervicalgia: Secondary | ICD-10-CM | POA: Diagnosis not present

## 2020-07-14 DIAGNOSIS — M9902 Segmental and somatic dysfunction of thoracic region: Secondary | ICD-10-CM | POA: Diagnosis not present

## 2020-07-14 DIAGNOSIS — M9907 Segmental and somatic dysfunction of upper extremity: Secondary | ICD-10-CM | POA: Diagnosis not present

## 2020-07-14 DIAGNOSIS — M9901 Segmental and somatic dysfunction of cervical region: Secondary | ICD-10-CM | POA: Diagnosis not present

## 2020-07-25 DIAGNOSIS — M9901 Segmental and somatic dysfunction of cervical region: Secondary | ICD-10-CM | POA: Diagnosis not present

## 2020-07-25 DIAGNOSIS — M542 Cervicalgia: Secondary | ICD-10-CM | POA: Diagnosis not present

## 2020-07-25 DIAGNOSIS — M9902 Segmental and somatic dysfunction of thoracic region: Secondary | ICD-10-CM | POA: Diagnosis not present

## 2020-07-25 DIAGNOSIS — M9907 Segmental and somatic dysfunction of upper extremity: Secondary | ICD-10-CM | POA: Diagnosis not present

## 2020-07-27 DIAGNOSIS — F99 Mental disorder, not otherwise specified: Secondary | ICD-10-CM | POA: Diagnosis not present

## 2020-08-05 DIAGNOSIS — Z0000 Encounter for general adult medical examination without abnormal findings: Secondary | ICD-10-CM | POA: Diagnosis not present

## 2020-08-05 DIAGNOSIS — Z23 Encounter for immunization: Secondary | ICD-10-CM | POA: Diagnosis not present

## 2020-08-08 DIAGNOSIS — M9907 Segmental and somatic dysfunction of upper extremity: Secondary | ICD-10-CM | POA: Diagnosis not present

## 2020-08-08 DIAGNOSIS — M9902 Segmental and somatic dysfunction of thoracic region: Secondary | ICD-10-CM | POA: Diagnosis not present

## 2020-08-08 DIAGNOSIS — M542 Cervicalgia: Secondary | ICD-10-CM | POA: Diagnosis not present

## 2020-08-08 DIAGNOSIS — M9901 Segmental and somatic dysfunction of cervical region: Secondary | ICD-10-CM | POA: Diagnosis not present

## 2020-08-10 DIAGNOSIS — F99 Mental disorder, not otherwise specified: Secondary | ICD-10-CM | POA: Diagnosis not present

## 2020-08-12 DIAGNOSIS — E291 Testicular hypofunction: Secondary | ICD-10-CM | POA: Diagnosis not present

## 2020-08-12 DIAGNOSIS — Z13220 Encounter for screening for lipoid disorders: Secondary | ICD-10-CM | POA: Diagnosis not present

## 2020-08-12 DIAGNOSIS — Z0000 Encounter for general adult medical examination without abnormal findings: Secondary | ICD-10-CM | POA: Diagnosis not present

## 2020-08-15 DIAGNOSIS — F99 Mental disorder, not otherwise specified: Secondary | ICD-10-CM | POA: Diagnosis not present

## 2020-08-23 DIAGNOSIS — M9901 Segmental and somatic dysfunction of cervical region: Secondary | ICD-10-CM | POA: Diagnosis not present

## 2020-08-23 DIAGNOSIS — M542 Cervicalgia: Secondary | ICD-10-CM | POA: Diagnosis not present

## 2020-08-23 DIAGNOSIS — M9907 Segmental and somatic dysfunction of upper extremity: Secondary | ICD-10-CM | POA: Diagnosis not present

## 2020-08-23 DIAGNOSIS — M9902 Segmental and somatic dysfunction of thoracic region: Secondary | ICD-10-CM | POA: Diagnosis not present

## 2020-09-03 ENCOUNTER — Other Ambulatory Visit: Payer: Self-pay

## 2020-09-03 DIAGNOSIS — Z20822 Contact with and (suspected) exposure to covid-19: Secondary | ICD-10-CM

## 2020-09-04 DIAGNOSIS — Z20822 Contact with and (suspected) exposure to covid-19: Secondary | ICD-10-CM | POA: Diagnosis not present

## 2020-09-06 ENCOUNTER — Ambulatory Visit: Payer: Self-pay | Admitting: *Deleted

## 2020-09-06 LAB — NOVEL CORONAVIRUS, NAA: SARS-CoV-2, NAA: DETECTED — AB

## 2020-09-06 LAB — SARS-COV-2, NAA 2 DAY TAT

## 2020-09-06 NOTE — Telephone Encounter (Signed)
Patient called and viewed My Chart restults on 09/06/20 at 6:52 pm and notified of positive COVID-19 test results. Pt verbalized understanding. Pt reports symptoms of fever, sinus congestion. Criteria for self-isolation:  -Please quarantine and isolate at home  for at least 10 days since symptoms started AND - At least 24 hours fever free without the use of fever reducing medications such as Tylenol or Ibuprofen AND - Improvement in respiratory symptoms Use over-the-counter medications for symptoms.If you develop respiratory issues/distress, seek medical care in the Emergency Department.  If you must leave home or if you have to be around others please wear a mask. Please limit contact with immediate family members in the home, practice social distancing, frequent handwashing and clean hard surfaces touched frequently with household cleaning products. Members of your household will also need to quarantine and test. Pt informed that the health department will likely follow up and may have additional recommendations. Will notify [facility].

## 2020-09-21 DIAGNOSIS — F99 Mental disorder, not otherwise specified: Secondary | ICD-10-CM | POA: Diagnosis not present

## 2020-09-26 DIAGNOSIS — M9901 Segmental and somatic dysfunction of cervical region: Secondary | ICD-10-CM | POA: Diagnosis not present

## 2020-09-26 DIAGNOSIS — M542 Cervicalgia: Secondary | ICD-10-CM | POA: Diagnosis not present

## 2020-09-26 DIAGNOSIS — M9907 Segmental and somatic dysfunction of upper extremity: Secondary | ICD-10-CM | POA: Diagnosis not present

## 2020-09-26 DIAGNOSIS — M9902 Segmental and somatic dysfunction of thoracic region: Secondary | ICD-10-CM | POA: Diagnosis not present

## 2020-10-05 DIAGNOSIS — F99 Mental disorder, not otherwise specified: Secondary | ICD-10-CM | POA: Diagnosis not present

## 2020-10-11 DIAGNOSIS — M9902 Segmental and somatic dysfunction of thoracic region: Secondary | ICD-10-CM | POA: Diagnosis not present

## 2020-10-11 DIAGNOSIS — M542 Cervicalgia: Secondary | ICD-10-CM | POA: Diagnosis not present

## 2020-10-11 DIAGNOSIS — M9901 Segmental and somatic dysfunction of cervical region: Secondary | ICD-10-CM | POA: Diagnosis not present

## 2020-10-11 DIAGNOSIS — M9907 Segmental and somatic dysfunction of upper extremity: Secondary | ICD-10-CM | POA: Diagnosis not present

## 2020-10-19 DIAGNOSIS — F99 Mental disorder, not otherwise specified: Secondary | ICD-10-CM | POA: Diagnosis not present

## 2020-10-27 DIAGNOSIS — M9902 Segmental and somatic dysfunction of thoracic region: Secondary | ICD-10-CM | POA: Diagnosis not present

## 2020-10-27 DIAGNOSIS — M9901 Segmental and somatic dysfunction of cervical region: Secondary | ICD-10-CM | POA: Diagnosis not present

## 2020-10-27 DIAGNOSIS — M542 Cervicalgia: Secondary | ICD-10-CM | POA: Diagnosis not present

## 2020-10-27 DIAGNOSIS — M9907 Segmental and somatic dysfunction of upper extremity: Secondary | ICD-10-CM | POA: Diagnosis not present

## 2020-11-02 DIAGNOSIS — F99 Mental disorder, not otherwise specified: Secondary | ICD-10-CM | POA: Diagnosis not present

## 2020-11-14 DIAGNOSIS — M9901 Segmental and somatic dysfunction of cervical region: Secondary | ICD-10-CM | POA: Diagnosis not present

## 2020-11-14 DIAGNOSIS — M9902 Segmental and somatic dysfunction of thoracic region: Secondary | ICD-10-CM | POA: Diagnosis not present

## 2020-11-14 DIAGNOSIS — M9907 Segmental and somatic dysfunction of upper extremity: Secondary | ICD-10-CM | POA: Diagnosis not present

## 2020-11-14 DIAGNOSIS — M542 Cervicalgia: Secondary | ICD-10-CM | POA: Diagnosis not present

## 2020-11-28 DIAGNOSIS — M9902 Segmental and somatic dysfunction of thoracic region: Secondary | ICD-10-CM | POA: Diagnosis not present

## 2020-11-28 DIAGNOSIS — M542 Cervicalgia: Secondary | ICD-10-CM | POA: Diagnosis not present

## 2020-11-28 DIAGNOSIS — M9907 Segmental and somatic dysfunction of upper extremity: Secondary | ICD-10-CM | POA: Diagnosis not present

## 2020-11-28 DIAGNOSIS — M9901 Segmental and somatic dysfunction of cervical region: Secondary | ICD-10-CM | POA: Diagnosis not present

## 2020-11-30 DIAGNOSIS — F99 Mental disorder, not otherwise specified: Secondary | ICD-10-CM | POA: Diagnosis not present

## 2020-12-08 DIAGNOSIS — R7303 Prediabetes: Secondary | ICD-10-CM | POA: Diagnosis not present

## 2020-12-08 DIAGNOSIS — E782 Mixed hyperlipidemia: Secondary | ICD-10-CM | POA: Diagnosis not present

## 2020-12-15 DIAGNOSIS — M9902 Segmental and somatic dysfunction of thoracic region: Secondary | ICD-10-CM | POA: Diagnosis not present

## 2020-12-15 DIAGNOSIS — M9901 Segmental and somatic dysfunction of cervical region: Secondary | ICD-10-CM | POA: Diagnosis not present

## 2020-12-15 DIAGNOSIS — M542 Cervicalgia: Secondary | ICD-10-CM | POA: Diagnosis not present

## 2020-12-15 DIAGNOSIS — M9907 Segmental and somatic dysfunction of upper extremity: Secondary | ICD-10-CM | POA: Diagnosis not present

## 2020-12-22 DIAGNOSIS — F99 Mental disorder, not otherwise specified: Secondary | ICD-10-CM | POA: Diagnosis not present

## 2020-12-27 DIAGNOSIS — M542 Cervicalgia: Secondary | ICD-10-CM | POA: Diagnosis not present

## 2020-12-27 DIAGNOSIS — M9902 Segmental and somatic dysfunction of thoracic region: Secondary | ICD-10-CM | POA: Diagnosis not present

## 2020-12-27 DIAGNOSIS — M9901 Segmental and somatic dysfunction of cervical region: Secondary | ICD-10-CM | POA: Diagnosis not present

## 2020-12-27 DIAGNOSIS — M9907 Segmental and somatic dysfunction of upper extremity: Secondary | ICD-10-CM | POA: Diagnosis not present

## 2020-12-28 DIAGNOSIS — F99 Mental disorder, not otherwise specified: Secondary | ICD-10-CM | POA: Diagnosis not present

## 2021-01-06 DIAGNOSIS — F99 Mental disorder, not otherwise specified: Secondary | ICD-10-CM | POA: Diagnosis not present

## 2021-01-11 DIAGNOSIS — F99 Mental disorder, not otherwise specified: Secondary | ICD-10-CM | POA: Diagnosis not present

## 2021-01-17 DIAGNOSIS — M542 Cervicalgia: Secondary | ICD-10-CM | POA: Diagnosis not present

## 2021-01-17 DIAGNOSIS — M9902 Segmental and somatic dysfunction of thoracic region: Secondary | ICD-10-CM | POA: Diagnosis not present

## 2021-01-17 DIAGNOSIS — M9901 Segmental and somatic dysfunction of cervical region: Secondary | ICD-10-CM | POA: Diagnosis not present

## 2021-01-17 DIAGNOSIS — M9907 Segmental and somatic dysfunction of upper extremity: Secondary | ICD-10-CM | POA: Diagnosis not present

## 2021-01-25 DIAGNOSIS — F99 Mental disorder, not otherwise specified: Secondary | ICD-10-CM | POA: Diagnosis not present

## 2021-01-31 DIAGNOSIS — M9907 Segmental and somatic dysfunction of upper extremity: Secondary | ICD-10-CM | POA: Diagnosis not present

## 2021-01-31 DIAGNOSIS — M9901 Segmental and somatic dysfunction of cervical region: Secondary | ICD-10-CM | POA: Diagnosis not present

## 2021-01-31 DIAGNOSIS — M542 Cervicalgia: Secondary | ICD-10-CM | POA: Diagnosis not present

## 2021-01-31 DIAGNOSIS — M9902 Segmental and somatic dysfunction of thoracic region: Secondary | ICD-10-CM | POA: Diagnosis not present

## 2021-02-02 DIAGNOSIS — R7303 Prediabetes: Secondary | ICD-10-CM | POA: Diagnosis not present

## 2021-02-02 DIAGNOSIS — Z6839 Body mass index (BMI) 39.0-39.9, adult: Secondary | ICD-10-CM | POA: Diagnosis not present

## 2021-02-02 DIAGNOSIS — E782 Mixed hyperlipidemia: Secondary | ICD-10-CM | POA: Diagnosis not present

## 2021-02-08 DIAGNOSIS — F99 Mental disorder, not otherwise specified: Secondary | ICD-10-CM | POA: Diagnosis not present

## 2021-02-17 DIAGNOSIS — Z20822 Contact with and (suspected) exposure to covid-19: Secondary | ICD-10-CM | POA: Diagnosis not present

## 2021-02-28 DIAGNOSIS — M542 Cervicalgia: Secondary | ICD-10-CM | POA: Diagnosis not present

## 2021-02-28 DIAGNOSIS — M9902 Segmental and somatic dysfunction of thoracic region: Secondary | ICD-10-CM | POA: Diagnosis not present

## 2021-02-28 DIAGNOSIS — F99 Mental disorder, not otherwise specified: Secondary | ICD-10-CM | POA: Diagnosis not present

## 2021-02-28 DIAGNOSIS — M9907 Segmental and somatic dysfunction of upper extremity: Secondary | ICD-10-CM | POA: Diagnosis not present

## 2021-02-28 DIAGNOSIS — M9901 Segmental and somatic dysfunction of cervical region: Secondary | ICD-10-CM | POA: Diagnosis not present

## 2021-03-22 DIAGNOSIS — F99 Mental disorder, not otherwise specified: Secondary | ICD-10-CM | POA: Diagnosis not present

## 2021-03-23 DIAGNOSIS — F99 Mental disorder, not otherwise specified: Secondary | ICD-10-CM | POA: Diagnosis not present

## 2021-04-05 DIAGNOSIS — F99 Mental disorder, not otherwise specified: Secondary | ICD-10-CM | POA: Diagnosis not present

## 2021-04-13 DIAGNOSIS — M542 Cervicalgia: Secondary | ICD-10-CM | POA: Diagnosis not present

## 2021-04-13 DIAGNOSIS — M9901 Segmental and somatic dysfunction of cervical region: Secondary | ICD-10-CM | POA: Diagnosis not present

## 2021-04-13 DIAGNOSIS — M9907 Segmental and somatic dysfunction of upper extremity: Secondary | ICD-10-CM | POA: Diagnosis not present

## 2021-04-13 DIAGNOSIS — M9902 Segmental and somatic dysfunction of thoracic region: Secondary | ICD-10-CM | POA: Diagnosis not present

## 2021-04-26 DIAGNOSIS — F99 Mental disorder, not otherwise specified: Secondary | ICD-10-CM | POA: Diagnosis not present

## 2021-04-27 DIAGNOSIS — M9907 Segmental and somatic dysfunction of upper extremity: Secondary | ICD-10-CM | POA: Diagnosis not present

## 2021-04-27 DIAGNOSIS — M9902 Segmental and somatic dysfunction of thoracic region: Secondary | ICD-10-CM | POA: Diagnosis not present

## 2021-04-27 DIAGNOSIS — M9901 Segmental and somatic dysfunction of cervical region: Secondary | ICD-10-CM | POA: Diagnosis not present

## 2021-04-27 DIAGNOSIS — M542 Cervicalgia: Secondary | ICD-10-CM | POA: Diagnosis not present

## 2021-06-27 DIAGNOSIS — H31002 Unspecified chorioretinal scars, left eye: Secondary | ICD-10-CM | POA: Diagnosis not present

## 2021-06-27 DIAGNOSIS — H5213 Myopia, bilateral: Secondary | ICD-10-CM | POA: Diagnosis not present

## 2021-06-27 DIAGNOSIS — H33302 Unspecified retinal break, left eye: Secondary | ICD-10-CM | POA: Diagnosis not present

## 2021-07-06 DIAGNOSIS — R809 Proteinuria, unspecified: Secondary | ICD-10-CM | POA: Diagnosis not present

## 2021-07-06 DIAGNOSIS — J069 Acute upper respiratory infection, unspecified: Secondary | ICD-10-CM | POA: Diagnosis not present

## 2021-07-06 DIAGNOSIS — R10814 Left lower quadrant abdominal tenderness: Secondary | ICD-10-CM | POA: Diagnosis not present

## 2021-07-06 DIAGNOSIS — R197 Diarrhea, unspecified: Secondary | ICD-10-CM | POA: Diagnosis not present

## 2021-07-06 DIAGNOSIS — R350 Frequency of micturition: Secondary | ICD-10-CM | POA: Diagnosis not present

## 2021-07-06 DIAGNOSIS — R6883 Chills (without fever): Secondary | ICD-10-CM | POA: Diagnosis not present

## 2021-07-06 DIAGNOSIS — Z20822 Contact with and (suspected) exposure to covid-19: Secondary | ICD-10-CM | POA: Diagnosis not present

## 2021-07-06 DIAGNOSIS — R0981 Nasal congestion: Secondary | ICD-10-CM | POA: Diagnosis not present

## 2021-07-07 ENCOUNTER — Ambulatory Visit
Admission: RE | Admit: 2021-07-07 | Discharge: 2021-07-07 | Disposition: A | Discharge status: Home / Self Care | Payer: Self-pay | Source: Ambulatory Visit | Attending: Internal Medicine | Admitting: Internal Medicine

## 2021-07-07 ENCOUNTER — Other Ambulatory Visit: Payer: Self-pay | Admitting: Physician Assistant

## 2021-07-07 ENCOUNTER — Other Ambulatory Visit: Payer: Self-pay | Admitting: Internal Medicine

## 2021-07-07 DIAGNOSIS — R0981 Nasal congestion: Secondary | ICD-10-CM

## 2021-07-07 DIAGNOSIS — R6883 Chills (without fever): Secondary | ICD-10-CM

## 2021-07-07 DIAGNOSIS — R10814 Left lower quadrant abdominal tenderness: Secondary | ICD-10-CM | POA: Diagnosis not present

## 2021-07-07 DIAGNOSIS — R197 Diarrhea, unspecified: Secondary | ICD-10-CM | POA: Diagnosis not present

## 2021-07-07 DIAGNOSIS — Z23 Encounter for immunization: Secondary | ICD-10-CM | POA: Diagnosis not present

## 2021-07-07 DIAGNOSIS — N3289 Other specified disorders of bladder: Secondary | ICD-10-CM | POA: Diagnosis not present

## 2021-07-07 DIAGNOSIS — R1032 Left lower quadrant pain: Secondary | ICD-10-CM | POA: Diagnosis not present

## 2021-07-07 MED ORDER — IOPAMIDOL (ISOVUE-300) INJECTION 61%
100.0000 mL | Freq: Once | INTRAVENOUS | Status: AC | PRN
  Administered 2021-07-07: 100 mL via INTRAVENOUS

## 2021-08-10 DIAGNOSIS — Z0000 Encounter for general adult medical examination without abnormal findings: Secondary | ICD-10-CM | POA: Diagnosis not present

## 2021-08-10 DIAGNOSIS — R7303 Prediabetes: Secondary | ICD-10-CM | POA: Diagnosis not present

## 2021-08-10 DIAGNOSIS — E782 Mixed hyperlipidemia: Secondary | ICD-10-CM | POA: Diagnosis not present

## 2021-08-10 DIAGNOSIS — Z125 Encounter for screening for malignant neoplasm of prostate: Secondary | ICD-10-CM | POA: Diagnosis not present

## 2021-09-12 DIAGNOSIS — E782 Mixed hyperlipidemia: Secondary | ICD-10-CM | POA: Diagnosis not present

## 2021-10-19 DIAGNOSIS — F99 Mental disorder, not otherwise specified: Secondary | ICD-10-CM | POA: Diagnosis not present

## 2021-10-26 DIAGNOSIS — Z630 Problems in relationship with spouse or partner: Secondary | ICD-10-CM | POA: Diagnosis not present

## 2021-11-11 DIAGNOSIS — F99 Mental disorder, not otherwise specified: Secondary | ICD-10-CM | POA: Diagnosis not present

## 2021-12-04 DIAGNOSIS — M9901 Segmental and somatic dysfunction of cervical region: Secondary | ICD-10-CM | POA: Diagnosis not present

## 2021-12-04 DIAGNOSIS — M542 Cervicalgia: Secondary | ICD-10-CM | POA: Diagnosis not present

## 2021-12-04 DIAGNOSIS — M9907 Segmental and somatic dysfunction of upper extremity: Secondary | ICD-10-CM | POA: Diagnosis not present

## 2021-12-04 DIAGNOSIS — M9902 Segmental and somatic dysfunction of thoracic region: Secondary | ICD-10-CM | POA: Diagnosis not present

## 2021-12-13 DIAGNOSIS — F99 Mental disorder, not otherwise specified: Secondary | ICD-10-CM | POA: Diagnosis not present

## 2021-12-27 DIAGNOSIS — F99 Mental disorder, not otherwise specified: Secondary | ICD-10-CM | POA: Diagnosis not present

## 2022-01-01 DIAGNOSIS — M9907 Segmental and somatic dysfunction of upper extremity: Secondary | ICD-10-CM | POA: Diagnosis not present

## 2022-01-01 DIAGNOSIS — M9901 Segmental and somatic dysfunction of cervical region: Secondary | ICD-10-CM | POA: Diagnosis not present

## 2022-01-01 DIAGNOSIS — M9902 Segmental and somatic dysfunction of thoracic region: Secondary | ICD-10-CM | POA: Diagnosis not present

## 2022-01-01 DIAGNOSIS — M542 Cervicalgia: Secondary | ICD-10-CM | POA: Diagnosis not present

## 2022-01-05 DIAGNOSIS — F99 Mental disorder, not otherwise specified: Secondary | ICD-10-CM | POA: Diagnosis not present

## 2022-01-24 DIAGNOSIS — R0789 Other chest pain: Secondary | ICD-10-CM | POA: Diagnosis not present

## 2022-02-02 DIAGNOSIS — R7303 Prediabetes: Secondary | ICD-10-CM | POA: Diagnosis not present

## 2022-02-02 DIAGNOSIS — Z7409 Other reduced mobility: Secondary | ICD-10-CM | POA: Diagnosis not present

## 2022-02-02 DIAGNOSIS — R5383 Other fatigue: Secondary | ICD-10-CM | POA: Diagnosis not present

## 2022-02-02 DIAGNOSIS — E538 Deficiency of other specified B group vitamins: Secondary | ICD-10-CM | POA: Diagnosis not present

## 2022-02-02 DIAGNOSIS — R635 Abnormal weight gain: Secondary | ICD-10-CM | POA: Diagnosis not present

## 2022-02-02 DIAGNOSIS — R790 Abnormal level of blood mineral: Secondary | ICD-10-CM | POA: Diagnosis not present

## 2022-02-02 DIAGNOSIS — E785 Hyperlipidemia, unspecified: Secondary | ICD-10-CM | POA: Diagnosis not present

## 2022-02-02 DIAGNOSIS — E559 Vitamin D deficiency, unspecified: Secondary | ICD-10-CM | POA: Diagnosis not present

## 2022-02-02 DIAGNOSIS — Z1329 Encounter for screening for other suspected endocrine disorder: Secondary | ICD-10-CM | POA: Diagnosis not present

## 2022-02-02 DIAGNOSIS — Z125 Encounter for screening for malignant neoplasm of prostate: Secondary | ICD-10-CM | POA: Diagnosis not present

## 2022-02-12 DIAGNOSIS — M542 Cervicalgia: Secondary | ICD-10-CM | POA: Diagnosis not present

## 2022-02-12 DIAGNOSIS — M9902 Segmental and somatic dysfunction of thoracic region: Secondary | ICD-10-CM | POA: Diagnosis not present

## 2022-02-12 DIAGNOSIS — M9907 Segmental and somatic dysfunction of upper extremity: Secondary | ICD-10-CM | POA: Diagnosis not present

## 2022-02-12 DIAGNOSIS — M9901 Segmental and somatic dysfunction of cervical region: Secondary | ICD-10-CM | POA: Diagnosis not present

## 2022-03-05 DIAGNOSIS — E291 Testicular hypofunction: Secondary | ICD-10-CM | POA: Diagnosis not present

## 2022-03-05 DIAGNOSIS — M9902 Segmental and somatic dysfunction of thoracic region: Secondary | ICD-10-CM | POA: Diagnosis not present

## 2022-03-05 DIAGNOSIS — M542 Cervicalgia: Secondary | ICD-10-CM | POA: Diagnosis not present

## 2022-03-05 DIAGNOSIS — R7303 Prediabetes: Secondary | ICD-10-CM | POA: Diagnosis not present

## 2022-03-05 DIAGNOSIS — R5383 Other fatigue: Secondary | ICD-10-CM | POA: Diagnosis not present

## 2022-03-05 DIAGNOSIS — R140 Abdominal distension (gaseous): Secondary | ICD-10-CM | POA: Diagnosis not present

## 2022-03-05 DIAGNOSIS — M9901 Segmental and somatic dysfunction of cervical region: Secondary | ICD-10-CM | POA: Diagnosis not present

## 2022-03-05 DIAGNOSIS — E785 Hyperlipidemia, unspecified: Secondary | ICD-10-CM | POA: Diagnosis not present

## 2022-03-05 DIAGNOSIS — M9907 Segmental and somatic dysfunction of upper extremity: Secondary | ICD-10-CM | POA: Diagnosis not present

## 2022-03-27 DIAGNOSIS — M9907 Segmental and somatic dysfunction of upper extremity: Secondary | ICD-10-CM | POA: Diagnosis not present

## 2022-03-27 DIAGNOSIS — M9902 Segmental and somatic dysfunction of thoracic region: Secondary | ICD-10-CM | POA: Diagnosis not present

## 2022-03-27 DIAGNOSIS — M542 Cervicalgia: Secondary | ICD-10-CM | POA: Diagnosis not present

## 2022-03-27 DIAGNOSIS — M9901 Segmental and somatic dysfunction of cervical region: Secondary | ICD-10-CM | POA: Diagnosis not present

## 2022-04-25 DIAGNOSIS — J029 Acute pharyngitis, unspecified: Secondary | ICD-10-CM | POA: Diagnosis not present

## 2022-04-25 DIAGNOSIS — Z20822 Contact with and (suspected) exposure to covid-19: Secondary | ICD-10-CM | POA: Diagnosis not present

## 2022-04-27 DIAGNOSIS — U071 COVID-19: Secondary | ICD-10-CM | POA: Diagnosis not present

## 2022-05-08 DIAGNOSIS — R748 Abnormal levels of other serum enzymes: Secondary | ICD-10-CM | POA: Diagnosis not present

## 2022-05-08 DIAGNOSIS — E790 Hyperuricemia without signs of inflammatory arthritis and tophaceous disease: Secondary | ICD-10-CM | POA: Diagnosis not present

## 2022-05-08 DIAGNOSIS — E785 Hyperlipidemia, unspecified: Secondary | ICD-10-CM | POA: Diagnosis not present

## 2022-05-08 DIAGNOSIS — E291 Testicular hypofunction: Secondary | ICD-10-CM | POA: Diagnosis not present

## 2022-05-08 DIAGNOSIS — R7989 Other specified abnormal findings of blood chemistry: Secondary | ICD-10-CM | POA: Diagnosis not present

## 2022-05-08 DIAGNOSIS — R7303 Prediabetes: Secondary | ICD-10-CM | POA: Diagnosis not present

## 2022-05-21 DIAGNOSIS — E785 Hyperlipidemia, unspecified: Secondary | ICD-10-CM | POA: Diagnosis not present

## 2022-05-21 DIAGNOSIS — E291 Testicular hypofunction: Secondary | ICD-10-CM | POA: Diagnosis not present

## 2022-06-07 IMAGING — CT CT ABD-PELV W/ CM
2 of 4 series · 17 of 46 positions shown, 19 images · IV contrast (iopamidol)
Comparison: None.

CLINICAL DATA: Left lower quadrant tenderness and diarrhea for 2
days.

EXAM:
CT ABDOMEN AND PELVIS WITH CONTRAST
TECHNIQUE: Multidetector CT imaging of the abdomen and pelvis was performed
using the standard protocol following bolus administration of
intravenous contrast.
CONTRAST:  100mL DIJGZO-KBB IOPAMIDOL (DIJGZO-KBB) INJECTION 61%

[Series 2: abd pelvis 5.00 br40 s3 axial · axial · 0.87mm/px · z∈[+1196,+1676]mm · 14 of 106 slices shown, 16 images]
[im 5/106  soft-tissue]
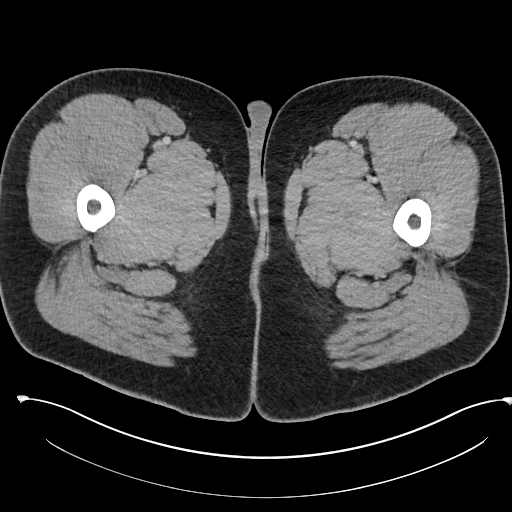
[im 5/106  bone]
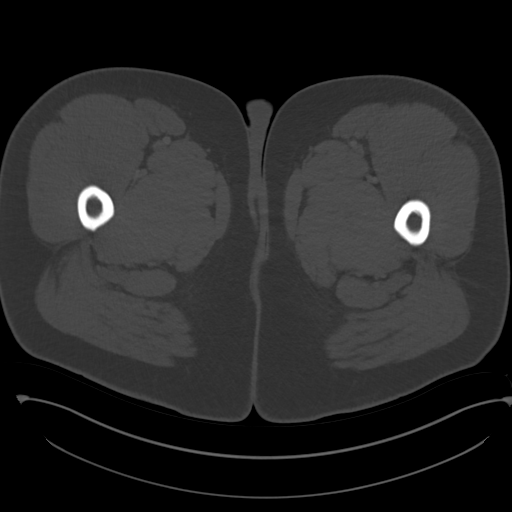
[im 14/106  soft-tissue]
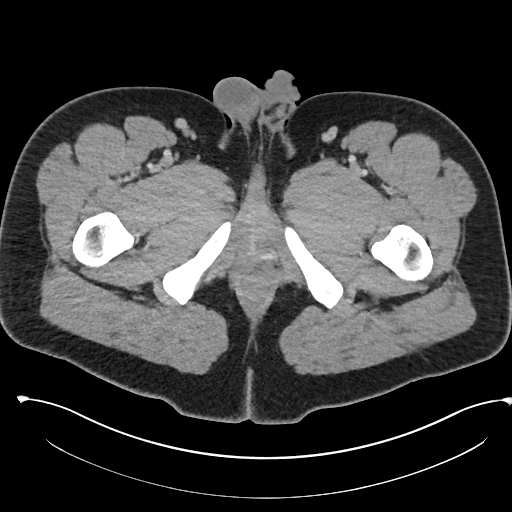
[im 19/106  soft-tissue]
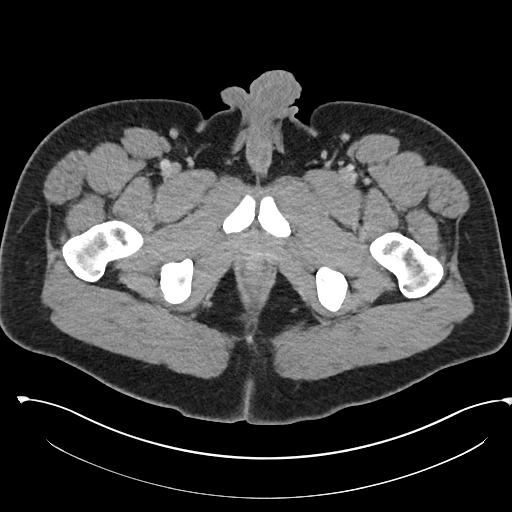
[im 28/106  soft-tissue]
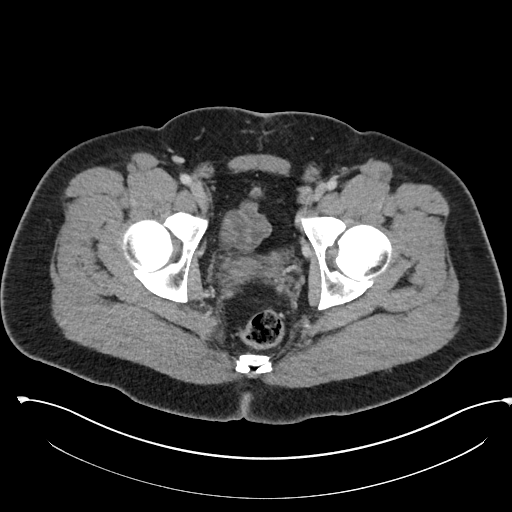
[im 37/106  soft-tissue]
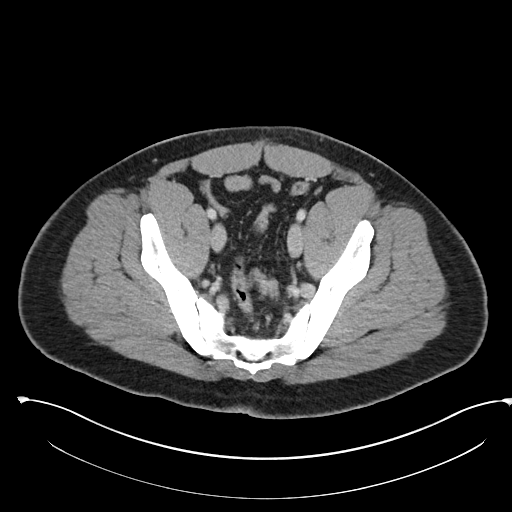
[im 42/106  soft-tissue]
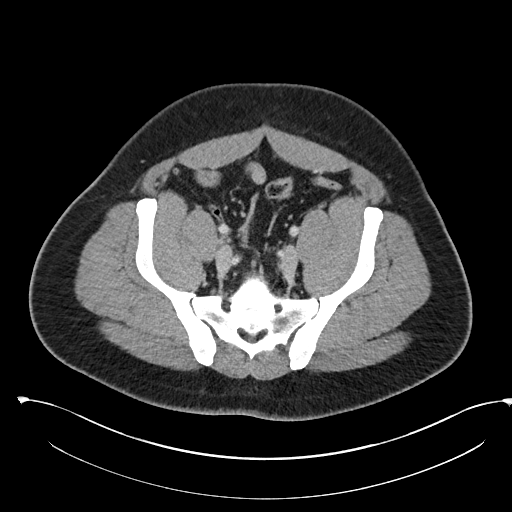
[im 51/106  soft-tissue]
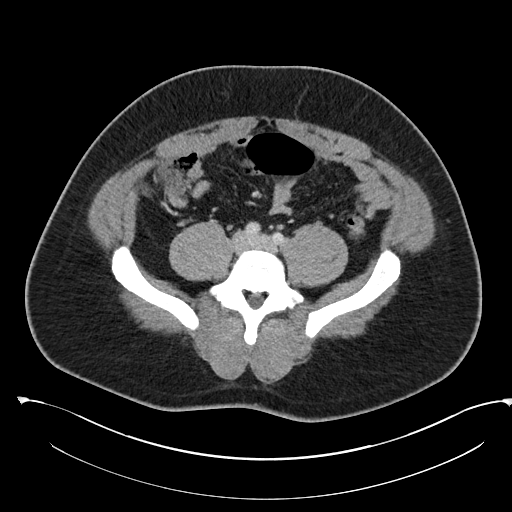
[im 55/106  soft-tissue]
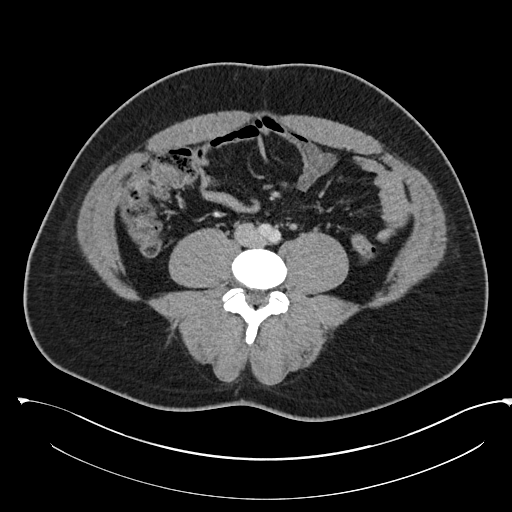
[im 64/106  soft-tissue]
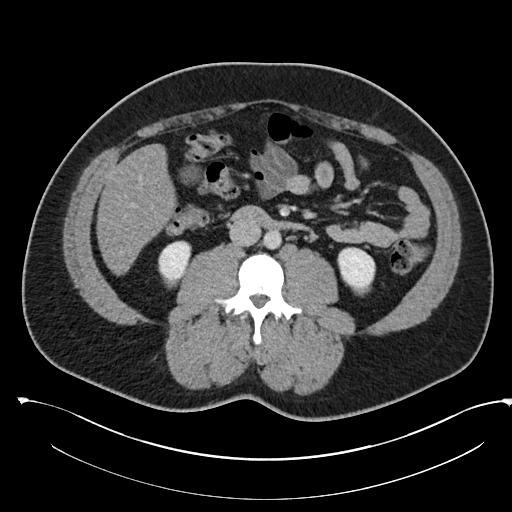
[im 64/106  bone]
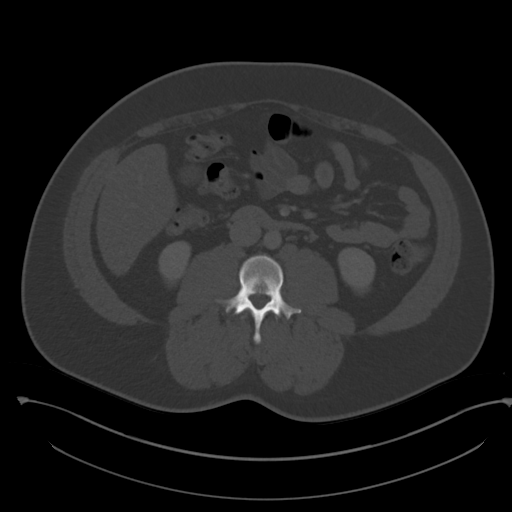
[im 69/106  soft-tissue]
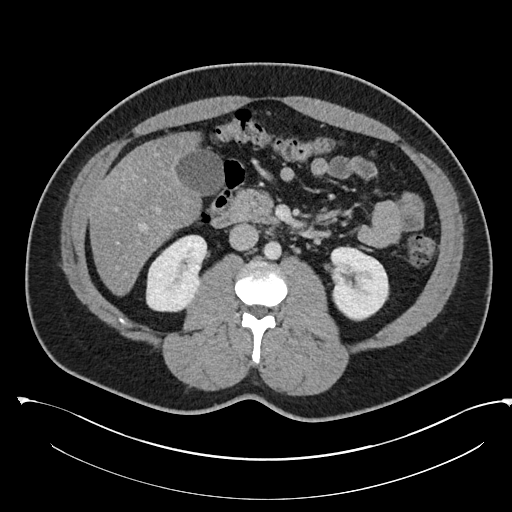
[im 78/106  soft-tissue]
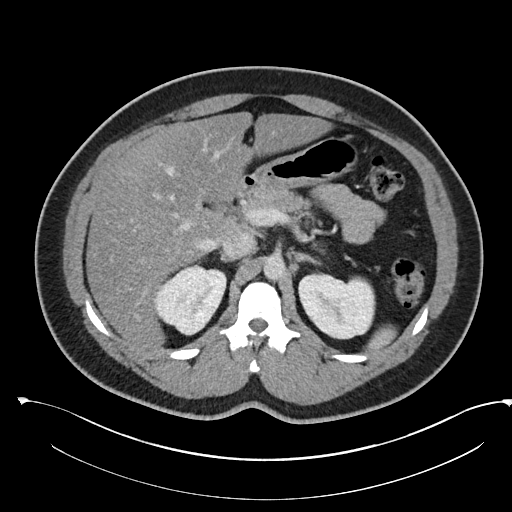
[im 87/106  soft-tissue]
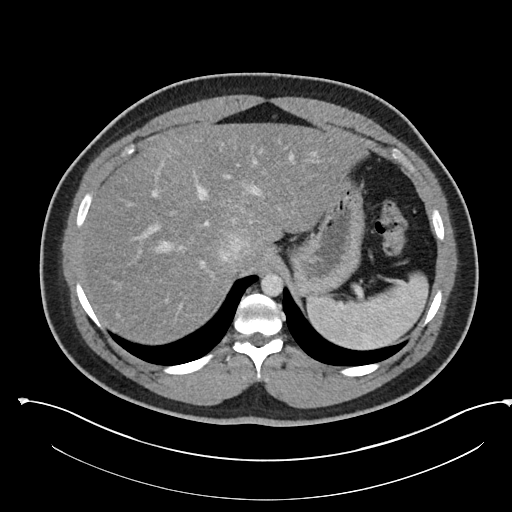
[im 92/106  soft-tissue]
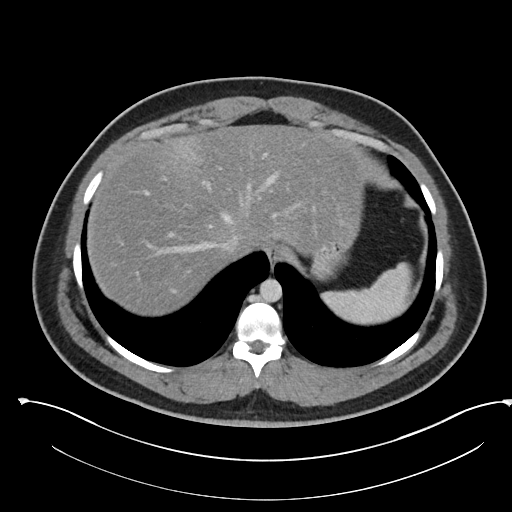
[im 101/106  soft-tissue]
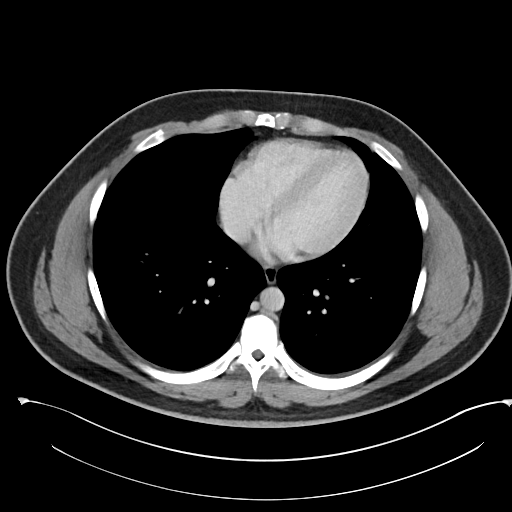

[Series 6: abd pelvis 2.00 br40 s3 cor · coronal · 0.87mm/px · 3 of 223 slices shown]
[im 75/223  soft-tissue]
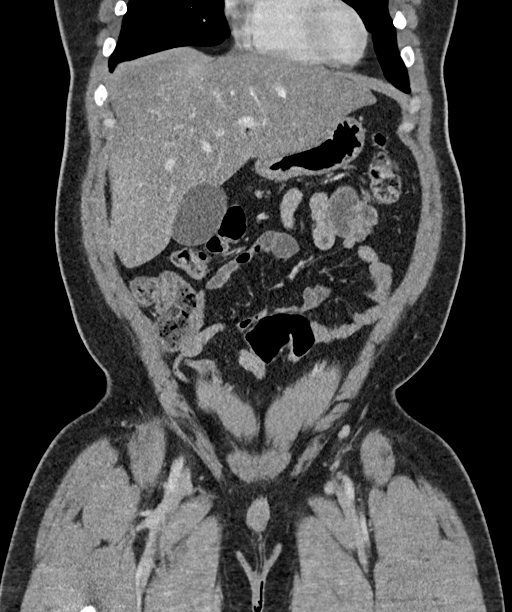
[im 99/223  soft-tissue]
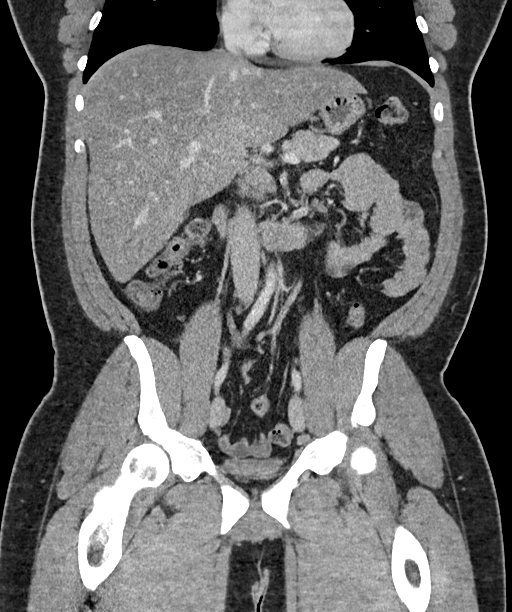
[im 124/223  soft-tissue]
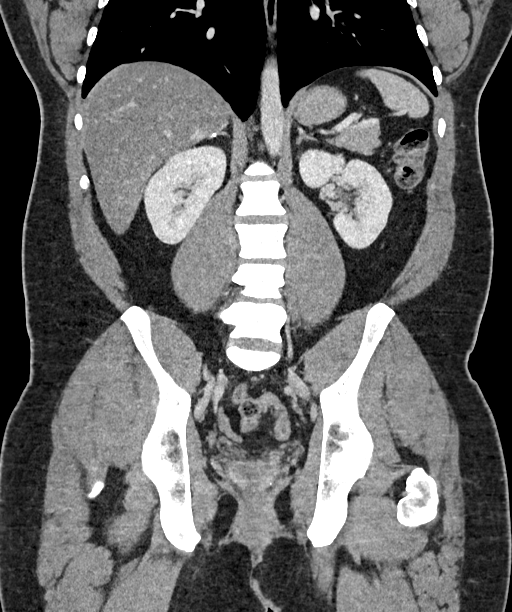

[17 of 46 positions shown; findings below may reference images not displayed]

FINDINGS: Lower chest: No acute abnormality.

Hepatobiliary: Hepatic steatosis. Gallbladder is unremarkable. No
biliary dilatation.

Pancreas: Unremarkable. No pancreatic ductal dilatation or
surrounding inflammatory changes.

Spleen: Normal in size without focal abnormality.

Adrenals/Urinary Tract: Adrenals are unremarkable. Kidneys are
unremarkable. Bladder is poorly distended and not well evaluated.

Stomach/Bowel: Stomach is within normal limits. Bowel is normal in
caliber. Normal appendix.

Vascular/Lymphatic: No significant vascular abnormality. No enlarged
lymph nodes.

Reproductive: Unremarkable.

Other: No free fluid.  Abdominal wall is unremarkable.

Musculoskeletal: No significant or acute osseous abnormality.
IMPRESSION: No acute abnormality or findings to account for reported symptoms.

## 2022-07-03 DIAGNOSIS — R0681 Apnea, not elsewhere classified: Secondary | ICD-10-CM | POA: Diagnosis not present

## 2022-07-03 DIAGNOSIS — J029 Acute pharyngitis, unspecified: Secondary | ICD-10-CM | POA: Diagnosis not present

## 2022-07-03 DIAGNOSIS — R0683 Snoring: Secondary | ICD-10-CM | POA: Diagnosis not present

## 2022-07-03 DIAGNOSIS — R599 Enlarged lymph nodes, unspecified: Secondary | ICD-10-CM | POA: Diagnosis not present

## 2022-07-13 DIAGNOSIS — G4733 Obstructive sleep apnea (adult) (pediatric): Secondary | ICD-10-CM | POA: Diagnosis not present

## 2022-07-25 DIAGNOSIS — G4733 Obstructive sleep apnea (adult) (pediatric): Secondary | ICD-10-CM | POA: Diagnosis not present

## 2022-08-07 DIAGNOSIS — G4733 Obstructive sleep apnea (adult) (pediatric): Secondary | ICD-10-CM | POA: Diagnosis not present

## 2022-08-07 DIAGNOSIS — R400 Somnolence: Secondary | ICD-10-CM | POA: Diagnosis not present

## 2022-08-16 DIAGNOSIS — M542 Cervicalgia: Secondary | ICD-10-CM | POA: Diagnosis not present

## 2022-08-16 DIAGNOSIS — M9902 Segmental and somatic dysfunction of thoracic region: Secondary | ICD-10-CM | POA: Diagnosis not present

## 2022-08-16 DIAGNOSIS — M9901 Segmental and somatic dysfunction of cervical region: Secondary | ICD-10-CM | POA: Diagnosis not present

## 2022-08-16 DIAGNOSIS — M9907 Segmental and somatic dysfunction of upper extremity: Secondary | ICD-10-CM | POA: Diagnosis not present

## 2022-08-29 DIAGNOSIS — R059 Cough, unspecified: Secondary | ICD-10-CM | POA: Diagnosis not present

## 2022-09-07 DIAGNOSIS — R400 Somnolence: Secondary | ICD-10-CM | POA: Diagnosis not present

## 2022-09-07 DIAGNOSIS — G4733 Obstructive sleep apnea (adult) (pediatric): Secondary | ICD-10-CM | POA: Diagnosis not present

## 2022-09-13 DIAGNOSIS — Z23 Encounter for immunization: Secondary | ICD-10-CM | POA: Diagnosis not present

## 2022-09-13 DIAGNOSIS — Z125 Encounter for screening for malignant neoplasm of prostate: Secondary | ICD-10-CM | POA: Diagnosis not present

## 2022-09-13 DIAGNOSIS — E291 Testicular hypofunction: Secondary | ICD-10-CM | POA: Diagnosis not present

## 2022-09-13 DIAGNOSIS — Z0000 Encounter for general adult medical examination without abnormal findings: Secondary | ICD-10-CM | POA: Diagnosis not present

## 2022-09-13 DIAGNOSIS — E782 Mixed hyperlipidemia: Secondary | ICD-10-CM | POA: Diagnosis not present

## 2022-09-13 DIAGNOSIS — R7303 Prediabetes: Secondary | ICD-10-CM | POA: Diagnosis not present

## 2022-09-28 DIAGNOSIS — H35413 Lattice degeneration of retina, bilateral: Secondary | ICD-10-CM | POA: Diagnosis not present

## 2022-09-28 DIAGNOSIS — H31002 Unspecified chorioretinal scars, left eye: Secondary | ICD-10-CM | POA: Diagnosis not present

## 2022-09-28 DIAGNOSIS — H5213 Myopia, bilateral: Secondary | ICD-10-CM | POA: Diagnosis not present

## 2022-10-08 DIAGNOSIS — G4733 Obstructive sleep apnea (adult) (pediatric): Secondary | ICD-10-CM | POA: Diagnosis not present

## 2022-10-08 DIAGNOSIS — R400 Somnolence: Secondary | ICD-10-CM | POA: Diagnosis not present

## 2022-10-24 DIAGNOSIS — N3944 Nocturnal enuresis: Secondary | ICD-10-CM | POA: Diagnosis not present

## 2022-10-24 DIAGNOSIS — R350 Frequency of micturition: Secondary | ICD-10-CM | POA: Diagnosis not present

## 2022-10-24 DIAGNOSIS — E349 Endocrine disorder, unspecified: Secondary | ICD-10-CM | POA: Diagnosis not present

## 2022-11-02 DIAGNOSIS — H35413 Lattice degeneration of retina, bilateral: Secondary | ICD-10-CM | POA: Diagnosis not present

## 2022-11-06 DIAGNOSIS — G4733 Obstructive sleep apnea (adult) (pediatric): Secondary | ICD-10-CM | POA: Diagnosis not present

## 2022-11-06 DIAGNOSIS — R400 Somnolence: Secondary | ICD-10-CM | POA: Diagnosis not present

## 2022-11-12 DIAGNOSIS — E291 Testicular hypofunction: Secondary | ICD-10-CM | POA: Diagnosis not present

## 2022-11-12 DIAGNOSIS — R7303 Prediabetes: Secondary | ICD-10-CM | POA: Diagnosis not present

## 2022-11-12 DIAGNOSIS — E538 Deficiency of other specified B group vitamins: Secondary | ICD-10-CM | POA: Diagnosis not present

## 2022-11-12 DIAGNOSIS — E559 Vitamin D deficiency, unspecified: Secondary | ICD-10-CM | POA: Diagnosis not present

## 2022-11-12 DIAGNOSIS — E785 Hyperlipidemia, unspecified: Secondary | ICD-10-CM | POA: Diagnosis not present

## 2022-11-12 DIAGNOSIS — G4733 Obstructive sleep apnea (adult) (pediatric): Secondary | ICD-10-CM | POA: Diagnosis not present

## 2022-11-12 DIAGNOSIS — R5383 Other fatigue: Secondary | ICD-10-CM | POA: Diagnosis not present

## 2022-12-07 DIAGNOSIS — G4733 Obstructive sleep apnea (adult) (pediatric): Secondary | ICD-10-CM | POA: Diagnosis not present

## 2022-12-07 DIAGNOSIS — R400 Somnolence: Secondary | ICD-10-CM | POA: Diagnosis not present

## 2022-12-26 DIAGNOSIS — E785 Hyperlipidemia, unspecified: Secondary | ICD-10-CM | POA: Diagnosis not present

## 2022-12-26 DIAGNOSIS — R82998 Other abnormal findings in urine: Secondary | ICD-10-CM | POA: Diagnosis not present

## 2022-12-26 DIAGNOSIS — R5383 Other fatigue: Secondary | ICD-10-CM | POA: Diagnosis not present

## 2022-12-26 DIAGNOSIS — E291 Testicular hypofunction: Secondary | ICD-10-CM | POA: Diagnosis not present

## 2022-12-26 DIAGNOSIS — R7303 Prediabetes: Secondary | ICD-10-CM | POA: Diagnosis not present

## 2023-01-03 DIAGNOSIS — M9902 Segmental and somatic dysfunction of thoracic region: Secondary | ICD-10-CM | POA: Diagnosis not present

## 2023-01-03 DIAGNOSIS — M9901 Segmental and somatic dysfunction of cervical region: Secondary | ICD-10-CM | POA: Diagnosis not present

## 2023-01-03 DIAGNOSIS — M9907 Segmental and somatic dysfunction of upper extremity: Secondary | ICD-10-CM | POA: Diagnosis not present

## 2023-01-03 DIAGNOSIS — M542 Cervicalgia: Secondary | ICD-10-CM | POA: Diagnosis not present

## 2023-01-04 DIAGNOSIS — N3944 Nocturnal enuresis: Secondary | ICD-10-CM | POA: Diagnosis not present

## 2023-01-04 DIAGNOSIS — R7881 Bacteremia: Secondary | ICD-10-CM | POA: Diagnosis not present

## 2023-01-06 DIAGNOSIS — G4733 Obstructive sleep apnea (adult) (pediatric): Secondary | ICD-10-CM | POA: Diagnosis not present

## 2023-01-06 DIAGNOSIS — R400 Somnolence: Secondary | ICD-10-CM | POA: Diagnosis not present

## 2023-02-06 DIAGNOSIS — G4733 Obstructive sleep apnea (adult) (pediatric): Secondary | ICD-10-CM | POA: Diagnosis not present

## 2023-02-06 DIAGNOSIS — R400 Somnolence: Secondary | ICD-10-CM | POA: Diagnosis not present

## 2023-03-08 DIAGNOSIS — R400 Somnolence: Secondary | ICD-10-CM | POA: Diagnosis not present

## 2023-03-08 DIAGNOSIS — G4733 Obstructive sleep apnea (adult) (pediatric): Secondary | ICD-10-CM | POA: Diagnosis not present

## 2023-03-14 DIAGNOSIS — E291 Testicular hypofunction: Secondary | ICD-10-CM | POA: Diagnosis not present

## 2023-03-14 DIAGNOSIS — E782 Mixed hyperlipidemia: Secondary | ICD-10-CM | POA: Diagnosis not present

## 2023-03-14 DIAGNOSIS — R7303 Prediabetes: Secondary | ICD-10-CM | POA: Diagnosis not present

## 2023-03-14 DIAGNOSIS — G4733 Obstructive sleep apnea (adult) (pediatric): Secondary | ICD-10-CM | POA: Diagnosis not present

## 2023-03-15 DIAGNOSIS — G4733 Obstructive sleep apnea (adult) (pediatric): Secondary | ICD-10-CM | POA: Diagnosis not present

## 2023-04-08 DIAGNOSIS — R400 Somnolence: Secondary | ICD-10-CM | POA: Diagnosis not present

## 2023-04-08 DIAGNOSIS — G4733 Obstructive sleep apnea (adult) (pediatric): Secondary | ICD-10-CM | POA: Diagnosis not present

## 2023-04-15 DIAGNOSIS — Z8616 Personal history of COVID-19: Secondary | ICD-10-CM | POA: Diagnosis not present

## 2023-04-15 DIAGNOSIS — R059 Cough, unspecified: Secondary | ICD-10-CM | POA: Diagnosis not present

## 2023-04-15 DIAGNOSIS — R0981 Nasal congestion: Secondary | ICD-10-CM | POA: Diagnosis not present

## 2023-04-22 DIAGNOSIS — R051 Acute cough: Secondary | ICD-10-CM | POA: Diagnosis not present

## 2023-04-22 DIAGNOSIS — R058 Other specified cough: Secondary | ICD-10-CM | POA: Diagnosis not present

## 2023-05-09 DIAGNOSIS — G4733 Obstructive sleep apnea (adult) (pediatric): Secondary | ICD-10-CM | POA: Diagnosis not present

## 2023-05-09 DIAGNOSIS — R400 Somnolence: Secondary | ICD-10-CM | POA: Diagnosis not present

## 2023-07-31 DIAGNOSIS — G4733 Obstructive sleep apnea (adult) (pediatric): Secondary | ICD-10-CM | POA: Diagnosis not present
